# Patient Record
Sex: Female | Born: 1945 | Race: White | Hispanic: No
Health system: Southern US, Community
[De-identification: ages and names within clinical notes are randomized; demographics above are authoritative.]

---

## 2019-08-09 ENCOUNTER — Ambulatory Visit (INDEPENDENT_AMBULATORY_CARE_PROVIDER_SITE_OTHER): Payer: Medicare Other

## 2019-08-09 ENCOUNTER — Other Ambulatory Visit: Payer: Self-pay

## 2019-08-09 ENCOUNTER — Ambulatory Visit (INDEPENDENT_AMBULATORY_CARE_PROVIDER_SITE_OTHER): Payer: Medicare Other | Admitting: Orthopaedic Surgery

## 2019-08-09 DIAGNOSIS — M25551 Pain in right hip: Secondary | ICD-10-CM

## 2019-08-09 MED ORDER — TRAMADOL HCL 50 MG PO TABS: 50.0000 mg | ORAL_TABLET | Freq: Four times a day (QID) | ORAL | 0 refills | Status: DC | PRN

## 2019-08-09 MED ORDER — METHYLPREDNISOLONE 4 MG PO TABS: ORAL_TABLET | ORAL | 0 refills | Status: DC

## 2019-08-09 MED ORDER — IBUPROFEN 800 MG PO TABS: 800.0000 mg | ORAL_TABLET | Freq: Three times a day (TID) | ORAL | 0 refills | Status: AC | PRN

## 2019-08-09 MED ORDER — METHOCARBAMOL 500 MG PO TABS: 500.0000 mg | ORAL_TABLET | Freq: Four times a day (QID) | ORAL | 1 refills | Status: AC | PRN

## 2019-08-09 NOTE — Progress Notes (Signed)
Office Visit Note   Patient: Lindsey Kelley           Date of Birth: [birth year]           MRN: [id number] Visit Date: 08/09/2019              Requested by: No referring provider defined for this encounter. PCP: Patient, No Pcp Per   Assessment & Plan: Visit Diagnoses:  1. Pain in right hip     Plan: I did tell her that I feel that most of her symptoms are related to her spine.  The lowest level of the lumbar spine can be seen which has significant narrowing at L5-S1.  Her pain seems to be on the ischium and sciatic region on the right side.  Her hip exam is normal.  I recommended a 6-day steroid taper as well as a combination of Robaxin and tramadol.  I did refill her ibuprofen as well.  Obviously if things worsen acutely we would order an MRI with contrast of her lumbar spine.  She does have an appointment to see her spine surgeon in March when she is back up in [place].  All question concerns were answered and addressed.  Follow-up of my standpoint can be as needed unless things become acute.  Follow-Up Instructions: Return if symptoms worsen or fail to improve.   Orders:  Orders Placed This Encounter  Procedures  . XR HIP UNILAT W OR W/O PELVIS 1V RIGHT   Meds ordered this encounter  Medications  . methylPREDNISolone (MEDROL) 4 MG tablet    Sig: Medrol dose pack. Take as instructed    Dispense:  21 tablet    Refill:  0  . methocarbamol (ROBAXIN) 500 MG tablet    Sig: Take 1 tablet (500 mg total) by mouth every 6 (six) hours as needed for muscle spasms.    Dispense:  60 tablet    Refill:  1  . traMADol (ULTRAM) 50 MG tablet    Sig: Take 1-2 tablets (50-100 mg total) by mouth every 6 (six) hours as needed.    Dispense:  30 tablet    Refill:  0  . ibuprofen (ADVIL) 800 MG tablet    Sig: Take 1 tablet (800 mg total) by mouth every 8 (eight) hours as needed.    Dispense:  60 tablet    Refill:  0      Procedures: No procedures performed   Clinical Data: No  additional findings.   Subjective: Chief Complaint  Patient presents with  . Right Hip - Pain  The patient comes in with an acute history of right hip pain is been going on since about November of this past year.  She has been down visiting from [place] and is scheduled to go back home in early March.  She says she cannot sleep at night to the pain and it wakes her up sometimes.  She denies any groin pain.  She feels like it is her hip but she points to the low back and says she gets numbness and tingling goes all the way down to her feet.  She does report a history of multiple back surgeries of the lumbar spine.  She is on gabapentin and ibuprofen.  She denies any change in bowel bladder function.  She is not a diabetic.  She is a very petite individual.  HPI  Review of Systems She currently denies any headache, chest pain, shortness of breath, fever, chills, nausea, vomiting  Objective: Vital Signs: There were no vitals taken for this visit.  Physical Exam She is alert and orient x3 and in no acute distress.  She is very limber and gets on the exam table easily. Ortho Exam Examination of her left hip and right hip are normal.  She has full range of motion of both hips.  There is no pain over the IT band bilaterally.  There is no pain of the trochanteric area on either hip.  She has 5 out of 5 strength of her bilateral lower extremities.  She does have subjective decrease sensation on the bottom of both feet. Specialty Comments:  No specialty comments available.  Imaging: XR HIP UNILAT W OR W/O PELVIS 1V RIGHT  Result Date: 08/09/2019 An AP pelvis and lateral of the right hip shows no acute findings.  The hip joint is well located with no complicating features and has good space with no significant arthritic findings.  There is extensive lumbar spine surgery that can be seen on the AP pelvis.    PMFS History: There are no problems to display for this patient.  No past medical  history on file.  No family history on file.

## 2019-08-20 ENCOUNTER — Other Ambulatory Visit: Payer: Self-pay | Admitting: Radiology

## 2019-08-20 ENCOUNTER — Telehealth: Payer: Self-pay | Admitting: Orthopaedic Surgery

## 2019-08-20 DIAGNOSIS — M25551 Pain in right hip: Secondary | ICD-10-CM

## 2019-08-20 DIAGNOSIS — M5441 Lumbago with sciatica, right side: Secondary | ICD-10-CM

## 2019-08-20 NOTE — Telephone Encounter (Signed)
ORDERED

## 2019-08-20 NOTE — Telephone Encounter (Signed)
Pt called in said her spine pain isn't getting any better and she was told if it didn't she could proceed with getting an injection. She just doesn't know what kind of injection she was told to get.   [phone]

## 2019-08-20 NOTE — Telephone Encounter (Signed)
Please advise 

## 2019-08-20 NOTE — Telephone Encounter (Signed)
Would order MRI l-spine with contast r/o HNP and for ESI planing per Dr. Eliberto Ivory note

## 2019-08-21 ENCOUNTER — Other Ambulatory Visit: Payer: Self-pay | Admitting: Physician Assistant

## 2019-08-21 ENCOUNTER — Telehealth: Payer: Self-pay | Admitting: Orthopaedic Surgery

## 2019-08-21 MED ORDER — HYDROCODONE-ACETAMINOPHEN 5-325 MG PO TABS: 1.0000 | ORAL_TABLET | Freq: Four times a day (QID) | ORAL | 0 refills | Status: DC | PRN

## 2019-08-21 NOTE — Telephone Encounter (Signed)
Please advise 

## 2019-08-21 NOTE — Telephone Encounter (Signed)
Spoke with patient.

## 2019-08-21 NOTE — Telephone Encounter (Signed)
Pt called in and would like something stronger for her pain, she states current pain medication isn't helping. Pt would like a call back regarding the type of medication it will be changed to before calling the medication in. [facility] [place].  Call back [phone]

## 2019-08-24 ENCOUNTER — Ambulatory Visit
Admission: RE | Admit: 2019-08-24 | Discharge: 2019-08-24 | Disposition: A | Discharge status: Home / Self Care | Payer: Medicare Other | Source: Ambulatory Visit | Attending: Orthopaedic Surgery | Admitting: Orthopaedic Surgery

## 2019-08-24 ENCOUNTER — Other Ambulatory Visit: Payer: Self-pay

## 2019-08-24 DIAGNOSIS — M5441 Lumbago with sciatica, right side: Secondary | ICD-10-CM

## 2019-08-24 DIAGNOSIS — M25551 Pain in right hip: Secondary | ICD-10-CM

## 2019-08-24 MED ORDER — GADOBENATE DIMEGLUMINE 529 MG/ML IV SOLN
11.0000 mL | Freq: Once | INTRAVENOUS | Status: AC | PRN
  Administered 2019-08-24: 11 mL via INTRAVENOUS

## 2019-08-27 ENCOUNTER — Ambulatory Visit (INDEPENDENT_AMBULATORY_CARE_PROVIDER_SITE_OTHER): Payer: Medicare Other | Admitting: Physician Assistant

## 2019-08-27 ENCOUNTER — Other Ambulatory Visit: Payer: Self-pay

## 2019-08-27 ENCOUNTER — Telehealth: Payer: Self-pay | Admitting: Orthopaedic Surgery

## 2019-08-27 ENCOUNTER — Encounter: Payer: Self-pay | Admitting: Physician Assistant

## 2019-08-27 ENCOUNTER — Other Ambulatory Visit: Payer: Self-pay | Admitting: Physician Assistant

## 2019-08-27 DIAGNOSIS — G8929 Other chronic pain: Secondary | ICD-10-CM

## 2019-08-27 DIAGNOSIS — M5441 Lumbago with sciatica, right side: Secondary | ICD-10-CM

## 2019-08-27 MED ORDER — ACETAMINOPHEN-CODEINE #3 300-30 MG PO TABS: 1.0000 | ORAL_TABLET | ORAL | 0 refills | Status: AC | PRN

## 2019-08-27 NOTE — Telephone Encounter (Signed)
Placed patient on Gil's schedule at 2:30pm today.  FYI - Patient also requested from Tammy to have release of information form faxed over, patient states she can fill that out in office when she comes in today.

## 2019-08-27 NOTE — Progress Notes (Signed)
   Office Visit Note   Patient: Lindsey Kelley           Date of Birth: 02/24/1946           MRN: [id number] Visit Date: 08/27/2019              Requested by: No referring provider defined for this encounter. PCP: Patient, No Pcp Per   Assessment & Plan: Visit Diagnoses:  1. Right-sided low back pain with right-sided sciatica, unspecified chronicity     Plan: We will send her for a right L5-S1 epidural steroid injection.  Did prescribe Tylenol 3 which she is to use sparingly.  She will follow-up 2 weeks after the epidural steroid injection to see what type of response she had.  Questions were encouraged and answered.  Follow-Up Instructions: Return 2 Weeks s/p ESI.   Orders:  No orders of the defined types were placed in this encounter.  No orders of the defined types were placed in this encounter.     Procedures: No procedures performed   Clinical Data: No additional findings.   Subjective: Chief Complaint  Patient presents with  . Lower Back - Follow-up    HPI Mrs. Hoskinson returns today to go over the MRI of her lumbar spine.  She continues to have pain down the right leg to the lateral aspect of her right ankle and lateral aspect of the right foot.  She having difficulty sleeping due to the pain. MRI lumbar spine dated 08/24/2019 showed broad-based central disc protrusion at L5-S1 contacting both near the roots at S1 but encouragements worse at the right S1 nerve root.  Central and foraminal canals are patent.  Status post L2-3 posterior decompression L3-L5 fusion without any evidence of acute findings. Review of Systems Negative for fevers chills.  Please see HPI otherwise negative  Objective: Vital Signs: There were no vitals taken for this visit.  Physical Exam General: Well-developed well-nourished slightly anxious female in no acute distress. Ortho Exam  Specialty Comments:  No specialty comments available.  Imaging: No results found.   PMFS  History: There are no problems to display for this patient.  History reviewed. No pertinent past medical history.  History reviewed. No pertinent family history.  History reviewed. No pertinent surgical history. Social History   Occupational History  . Not on file  Tobacco Use  . Smoking status: Not on file  Substance and Sexual Activity  . Alcohol use: Not on file  . Drug use: Not on file  . Sexual activity: Not on file

## 2019-08-27 NOTE — Telephone Encounter (Signed)
Received vm from pt requesting Mri report be sent to a doctor in [place]. IC,lmvm advised she would need to sign authorization form before we can release record to anyone.

## 2019-08-27 NOTE — Telephone Encounter (Signed)
Pt called in requesting a sooner appt. I did let her know his next available was Monday. Pt said she's in a lot of pain in her right leg and would like a call regarding her pain.  [phone]

## 2019-08-27 NOTE — Addendum Note (Signed)
Addended by: Mardene Celeste B on: 08/27/2019 03:31 PM   Modules accepted: Orders

## 2019-08-29 ENCOUNTER — Ambulatory Visit: Payer: Medicare Other | Admitting: Orthopaedic Surgery

## 2019-08-30 ENCOUNTER — Other Ambulatory Visit: Payer: Self-pay

## 2019-08-30 ENCOUNTER — Ambulatory Visit
Admission: RE | Admit: 2019-08-30 | Discharge: 2019-08-30 | Disposition: A | Discharge status: Home / Self Care | Payer: Medicare Other | Source: Ambulatory Visit | Attending: Physician Assistant | Admitting: Physician Assistant

## 2019-08-30 ENCOUNTER — Other Ambulatory Visit: Payer: Self-pay | Admitting: Physician Assistant

## 2019-08-30 DIAGNOSIS — G8929 Other chronic pain: Secondary | ICD-10-CM

## 2019-08-30 MED ORDER — METHYLPREDNISOLONE ACETATE 40 MG/ML INJ SUSP (RADIOLOG
120.0000 mg | Freq: Once | INTRAMUSCULAR | Status: AC
  Administered 2019-08-30: 120 mg via EPIDURAL

## 2019-08-30 MED ORDER — IOPAMIDOL (ISOVUE-M 200) INJECTION 41%
1.0000 mL | Freq: Once | INTRAMUSCULAR | Status: AC
  Administered 2019-08-30: 1 mL via EPIDURAL

## 2019-08-30 NOTE — Discharge Instructions (Signed)

## 2019-09-10 ENCOUNTER — Encounter: Payer: Medicare Other | Admitting: Physical Medicine and Rehabilitation

## 2019-09-13 ENCOUNTER — Ambulatory Visit: Payer: Medicare Other | Admitting: Physician Assistant

## 2019-09-13 ENCOUNTER — Ambulatory Visit (INDEPENDENT_AMBULATORY_CARE_PROVIDER_SITE_OTHER): Payer: Medicare Other | Admitting: Orthopaedic Surgery

## 2019-09-13 ENCOUNTER — Encounter: Payer: Self-pay | Admitting: Orthopaedic Surgery

## 2019-09-13 ENCOUNTER — Other Ambulatory Visit: Payer: Self-pay

## 2019-09-13 VITALS — Wt 111.0 lb

## 2019-09-13 DIAGNOSIS — M5441 Lumbago with sciatica, right side: Secondary | ICD-10-CM

## 2019-09-13 MED ORDER — GABAPENTIN 300 MG PO CAPS: 300.0000 mg | ORAL_CAPSULE | Freq: Two times a day (BID) | ORAL | 1 refills | Status: AC

## 2019-09-13 NOTE — Progress Notes (Signed)
The patient returns after having an epidural steroid injection at [facility] imaging on 08/30/2019.  This was for a right-sided disc herniation which was more of a bulge that causing irritation of the S1 nerve root.  She says she is doing great since then and feels fabulous and has no issues.  She denies any weakness in her legs or numbness and tingling.  On exam she has negative straight leg raise bilaterally.  She has normal exam of her bilateral extremities and excellent strength.  Her sensation is normal as well.  She has been on Neurontin and will continue this but can start weaning it.  I will send in some more for her.  She did have [place] check her blood pressure which is running a little high but she does have an appointment she says later with her primary care physician.  She has been monitoring this regularly.  All question concerns were answered and addressed.  Follow-up will be as needed.

## 2021-06-26 IMAGING — MR MR LUMBAR SPINE WO/W CM
4 of 7 series · 24 of 48 positions shown · IV contrast (multihance)
Comparison: None.

CLINICAL DATA: Low back pain radiating into the right hip, buttock
and leg. History of prior lumbar surgery x2.

EXAM:
MRI LUMBAR SPINE WITHOUT AND WITH CONTRAST
TECHNIQUE: Multiplanar and multiecho pulse sequences of the lumbar spine were
obtained without and with intravenous contrast.
CONTRAST:  11 mL MULTIHANCE IV

[Series 2: T1 · sagittal · 4.0mm · 0.55mm/px · 4 of 15 slices shown (1 of 2)]
[im 1/15]
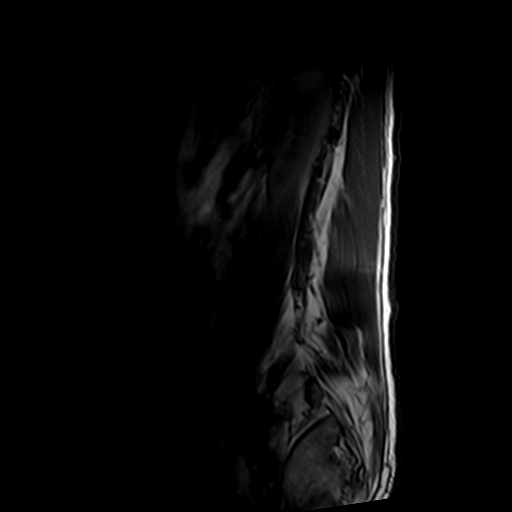
[im 5/15]
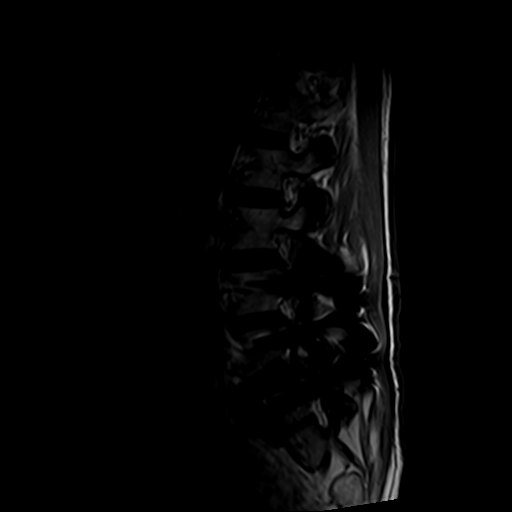
[im 10/15]
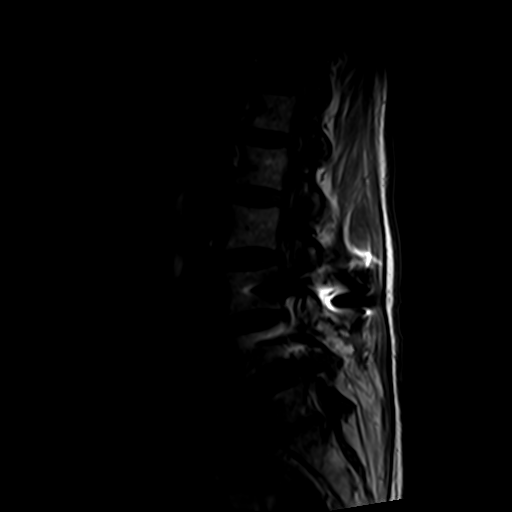
[im 15/15]
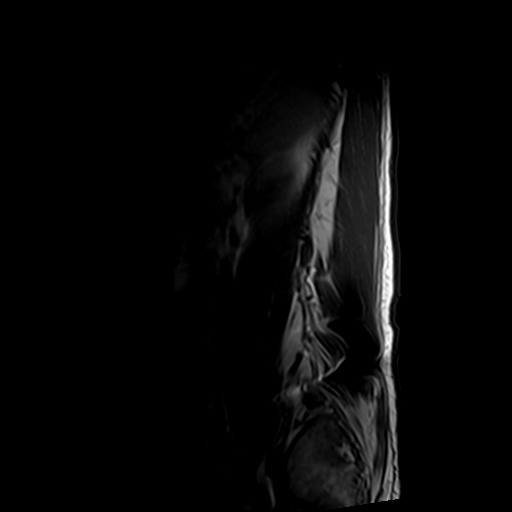

[Series 4: T2 · axial · 4.0mm · 0.70mm/px · z∈[-86,+116]mm · 8 of 32 slices shown (1 of 2)]
[im 1/32]
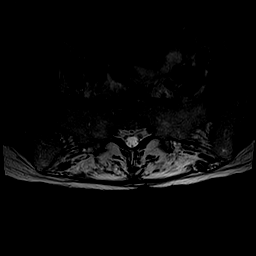
[im 4/32]
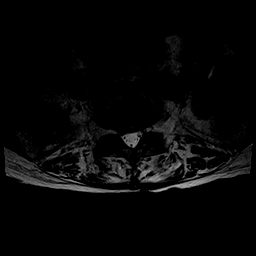
[im 11/32]
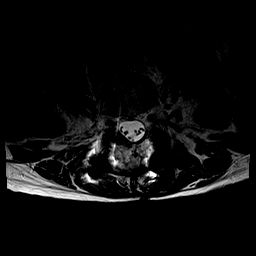
[im 14/32]
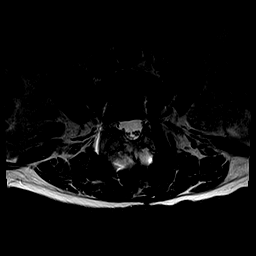
[im 18/32]
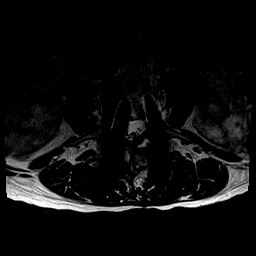
[im 21/32]
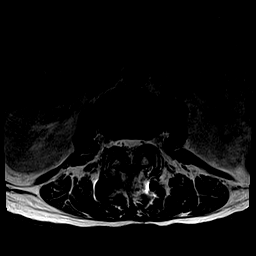
[im 28/32]
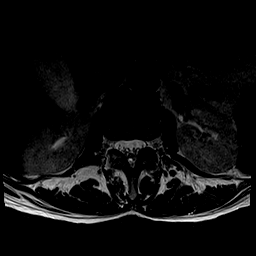
[im 32/32]
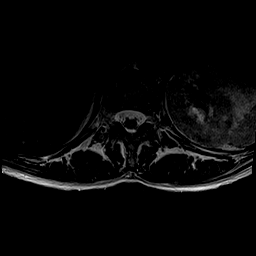

[Series 5: T1 · axial · 4.0mm · 0.35mm/px · z∈[-86,+77]mm · 7 of 32 slices shown (2 of 2)]
[im 1/32]
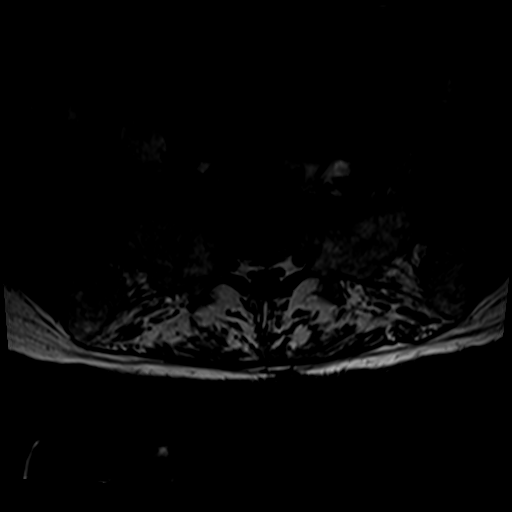
[im 4/32]
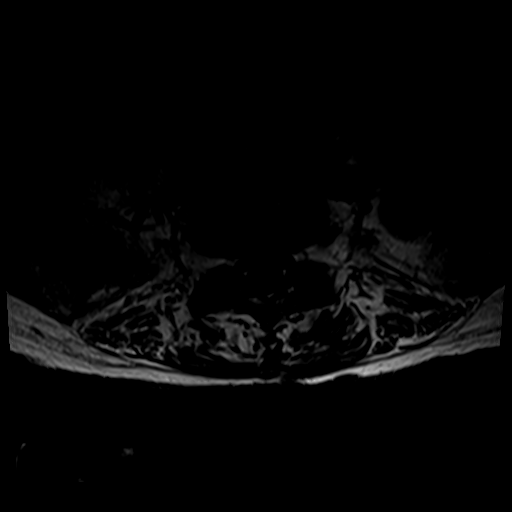
[im 11/32]
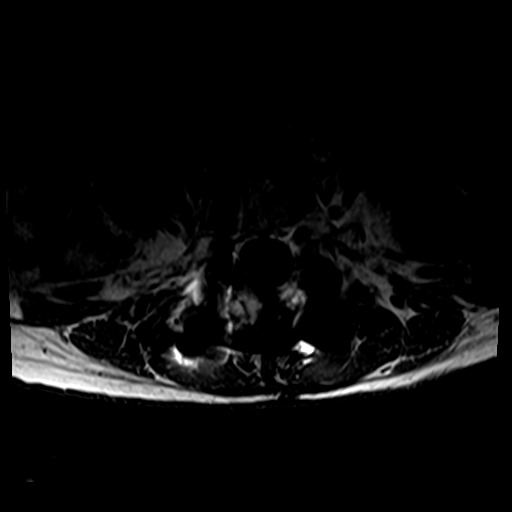
[im 14/32]
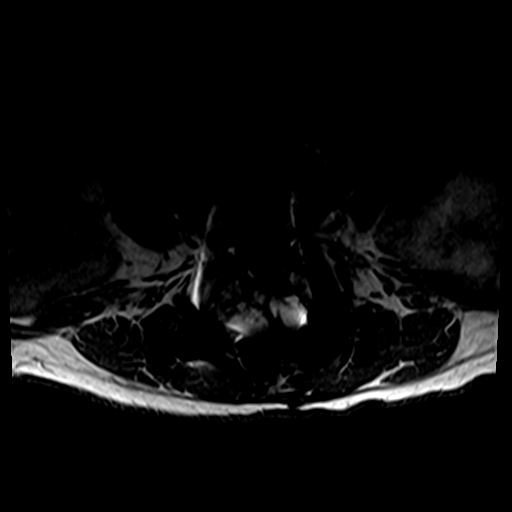
[im 18/32]
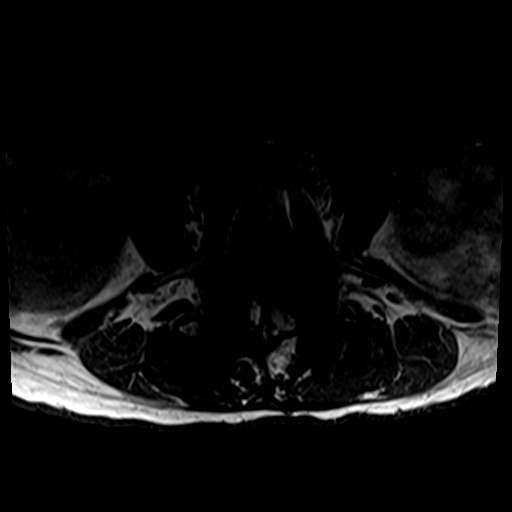
[im 21/32]
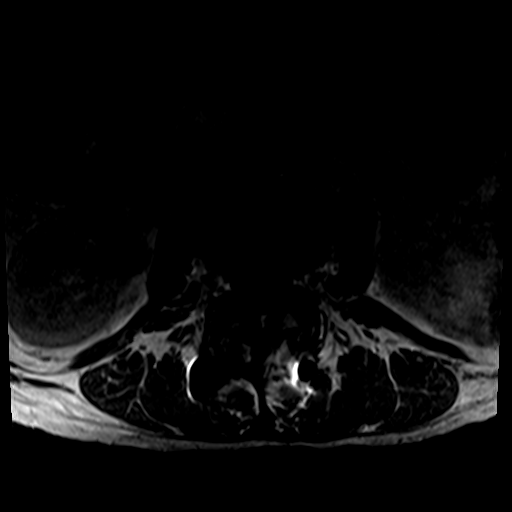
[im 28/32]
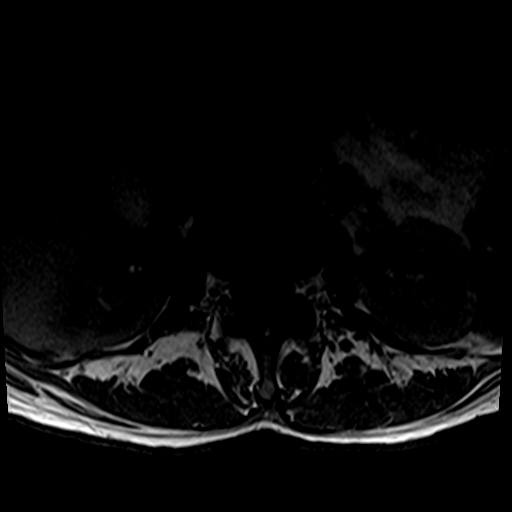

[Series 6: T2 · sagittal · 4.0mm · 0.55mm/px · 5 of 15 slices shown (2 of 2)]
[im 1/15]
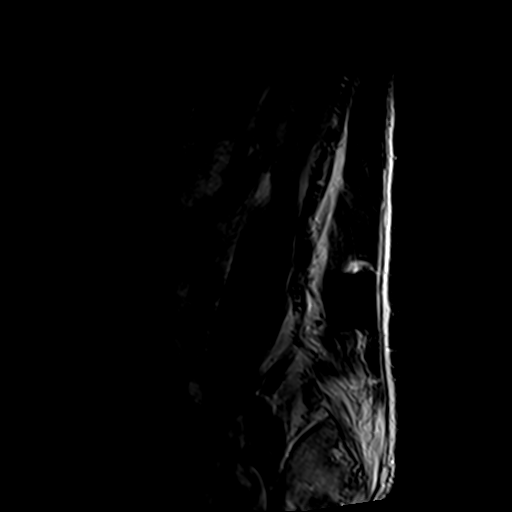
[im 4/15]
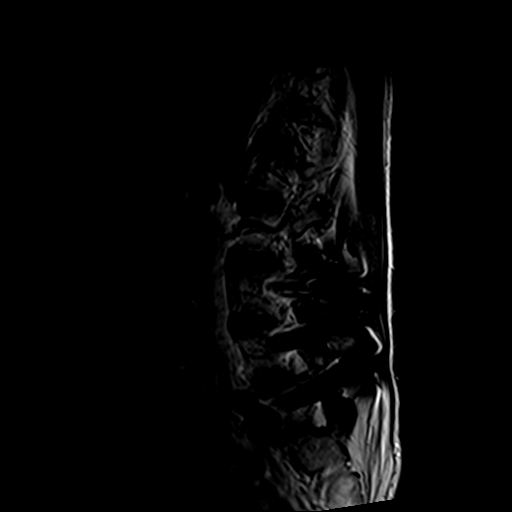
[im 8/15]
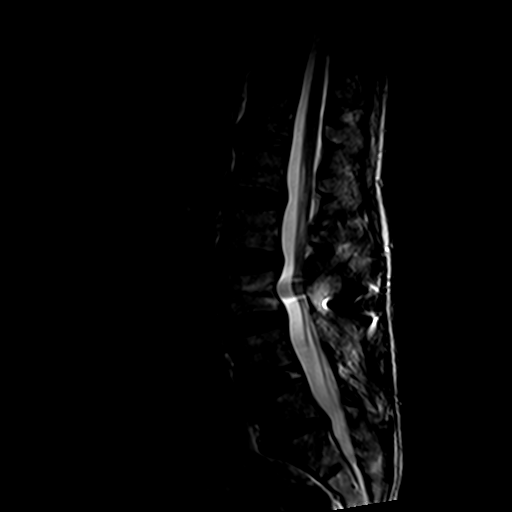
[im 11/15]
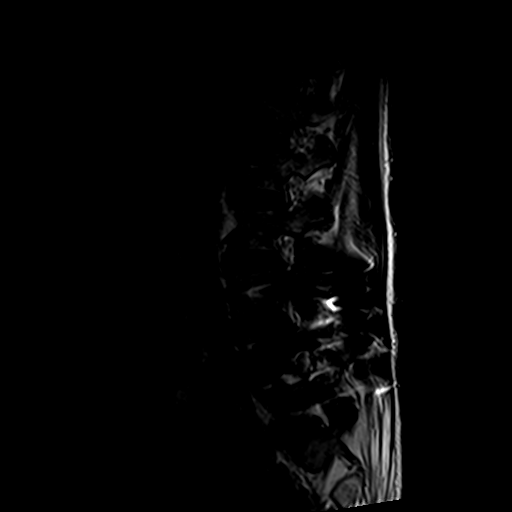
[im 15/15]
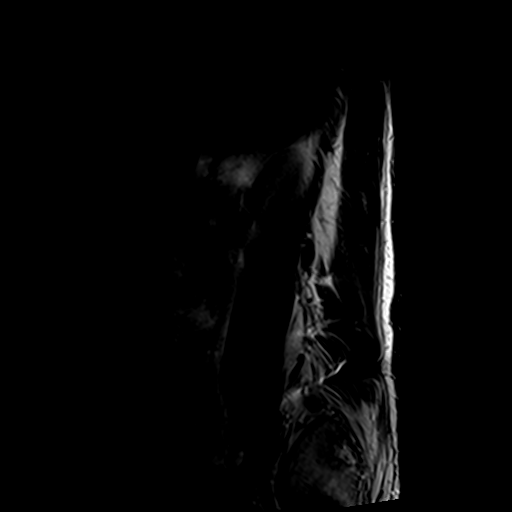

[24 of 48 positions shown; findings below may reference images not displayed]

FINDINGS: Segmentation:  Standard.

Alignment:  Maintained.

Vertebrae: No fracture, evidence of discitis, or bone lesion. The
patient is status post L3-5 fusion.

Conus medullaris and cauda equina: Conus extends to the L1-2 level.
Conus and cauda equina appear normal.

Paraspinal and other soft tissues: Negative.

Disc levels:

T10-11 and T11-12 are imaged in the sagittal plane only and
negative.

T12-L1: Negative.

L1-2: Very shallow central protrusion. No stenosis.

L2-3: The patient is status post posterior decompression. There is a
shallow disc bulge and moderate facet degenerative disease. No
stenosis.

L3-4: Status post laminectomy and fusion. No stenosis.

L4-5: Status post laminectomy and fusion. No stenosis.

L5-S1: Shallow, broad-based central disc protrusion is somewhat more
prominent to the right. Disc contacts the S1 roots in the
subarticular recesses. Encroachment is worse on the right S1 root.
Neural foramina are open.
IMPRESSION: 1. Shallow, broad-based central protrusion at L5-S1 is more
prominent to the right and contacts both S1 roots in the
subarticular recesses. Encroachment is worse on the right S1 root.
The central canal and foramina are open at this level.
2. Status post L2-3 posterior decompression and L3-5 fusion without
evidence of complication. The central canal and foramina are open at
each level.
